# Patient Record
Sex: Female | Born: 1960 | Race: Black or African American | Hispanic: No | Marital: Married | Smoking: Never smoker
Health system: Southern US, Community
[De-identification: ages and names within clinical notes are randomized; demographics above are authoritative.]

## PROBLEM LIST (undated history)

## (undated) DIAGNOSIS — J449 Chronic obstructive pulmonary disease, unspecified: Secondary | ICD-10-CM

## (undated) DIAGNOSIS — I509 Heart failure, unspecified: Secondary | ICD-10-CM

## (undated) DIAGNOSIS — I219 Acute myocardial infarction, unspecified: Secondary | ICD-10-CM

---

## 2018-02-01 ENCOUNTER — Other Ambulatory Visit: Payer: Self-pay

## 2018-02-01 ENCOUNTER — Inpatient Hospital Stay (HOSPITAL_COMMUNITY)
Admission: EM | Admit: 2018-02-01 | Discharge: 2018-02-02 | DRG: 641 | Disposition: A | Discharge status: Home / Self Care | Payer: Medicare Other | Attending: Internal Medicine | Admitting: Internal Medicine

## 2018-02-01 ENCOUNTER — Encounter (HOSPITAL_COMMUNITY): Payer: Self-pay | Admitting: Emergency Medicine

## 2018-02-01 ENCOUNTER — Emergency Department (HOSPITAL_COMMUNITY): Payer: Medicare Other

## 2018-02-01 DIAGNOSIS — Z7902 Long term (current) use of antithrombotics/antiplatelets: Secondary | ICD-10-CM

## 2018-02-01 DIAGNOSIS — I509 Heart failure, unspecified: Secondary | ICD-10-CM | POA: Diagnosis not present

## 2018-02-01 DIAGNOSIS — T502X5A Adverse effect of carbonic-anhydrase inhibitors, benzothiadiazides and other diuretics, initial encounter: Secondary | ICD-10-CM | POA: Diagnosis not present

## 2018-02-01 DIAGNOSIS — E869 Volume depletion, unspecified: Secondary | ICD-10-CM | POA: Diagnosis present

## 2018-02-01 DIAGNOSIS — N179 Acute kidney failure, unspecified: Secondary | ICD-10-CM | POA: Diagnosis present

## 2018-02-01 DIAGNOSIS — Z79899 Other long term (current) drug therapy: Secondary | ICD-10-CM

## 2018-02-01 DIAGNOSIS — E873 Alkalosis: Secondary | ICD-10-CM | POA: Diagnosis not present

## 2018-02-01 DIAGNOSIS — I2510 Atherosclerotic heart disease of native coronary artery without angina pectoris: Secondary | ICD-10-CM | POA: Diagnosis not present

## 2018-02-01 DIAGNOSIS — J449 Chronic obstructive pulmonary disease, unspecified: Secondary | ICD-10-CM | POA: Diagnosis present

## 2018-02-01 DIAGNOSIS — Z87891 Personal history of nicotine dependence: Secondary | ICD-10-CM

## 2018-02-01 DIAGNOSIS — E876 Hypokalemia: Principal | ICD-10-CM | POA: Diagnosis present

## 2018-02-01 DIAGNOSIS — R1110 Vomiting, unspecified: Secondary | ICD-10-CM

## 2018-02-01 DIAGNOSIS — R5382 Chronic fatigue, unspecified: Secondary | ICD-10-CM | POA: Diagnosis not present

## 2018-02-01 DIAGNOSIS — R5383 Other fatigue: Secondary | ICD-10-CM | POA: Diagnosis present

## 2018-02-01 DIAGNOSIS — R911 Solitary pulmonary nodule: Secondary | ICD-10-CM

## 2018-02-01 DIAGNOSIS — R55 Syncope and collapse: Secondary | ICD-10-CM

## 2018-02-01 DIAGNOSIS — J9811 Atelectasis: Secondary | ICD-10-CM | POA: Diagnosis not present

## 2018-02-01 DIAGNOSIS — Z9861 Coronary angioplasty status: Secondary | ICD-10-CM

## 2018-02-01 DIAGNOSIS — I252 Old myocardial infarction: Secondary | ICD-10-CM | POA: Diagnosis not present

## 2018-02-01 DIAGNOSIS — I4581 Long QT syndrome: Secondary | ICD-10-CM

## 2018-02-01 DIAGNOSIS — I25118 Atherosclerotic heart disease of native coronary artery with other forms of angina pectoris: Secondary | ICD-10-CM

## 2018-02-01 HISTORY — DX: Heart failure, unspecified: I50.9

## 2018-02-01 HISTORY — DX: Acute myocardial infarction, unspecified: I21.9

## 2018-02-01 HISTORY — DX: Chronic obstructive pulmonary disease, unspecified: J44.9

## 2018-02-01 LAB — CBC WITH DIFFERENTIAL/PLATELET
Abs Immature Granulocytes: 0.01 10*3/uL (ref 0.00–0.07)
BASOS PCT: 1 %
Basophils Absolute: 0 10*3/uL (ref 0.0–0.1)
EOS PCT: 1 %
Eosinophils Absolute: 0 10*3/uL (ref 0.0–0.5)
HEMATOCRIT: 38.3 % (ref 36.0–46.0)
Hemoglobin: 12.5 g/dL (ref 12.0–15.0)
Immature Granulocytes: 0 %
LYMPHS ABS: 2.4 10*3/uL (ref 0.7–4.0)
Lymphocytes Relative: 30 %
MCH: 30.4 pg (ref 26.0–34.0)
MCHC: 32.6 g/dL (ref 30.0–36.0)
MCV: 93.2 fL (ref 80.0–100.0)
MONOS PCT: 6 %
Monocytes Absolute: 0.5 10*3/uL (ref 0.1–1.0)
NEUTROS PCT: 62 %
Neutro Abs: 4.9 10*3/uL (ref 1.7–7.7)
PLATELETS: 332 10*3/uL (ref 150–400)
RBC: 4.11 MIL/uL (ref 3.87–5.11)
RDW: 13.3 % (ref 11.5–15.5)
WBC: 7.8 10*3/uL (ref 4.0–10.5)
nRBC: 0 % (ref 0.0–0.2)

## 2018-02-01 LAB — MAGNESIUM: Magnesium: 2.1 mg/dL (ref 1.7–2.4)

## 2018-02-01 LAB — BRAIN NATRIURETIC PEPTIDE: B Natriuretic Peptide: 15 pg/mL (ref 0.0–100.0)

## 2018-02-01 LAB — CBG MONITORING, ED: GLUCOSE-CAPILLARY: 139 mg/dL — AB (ref 70–99)

## 2018-02-01 LAB — I-STAT TROPONIN, ED: Troponin i, poc: 0 ng/mL (ref 0.00–0.08)

## 2018-02-01 LAB — BASIC METABOLIC PANEL
ANION GAP: 10 (ref 5–15)
Anion gap: 12 (ref 5–15)
BUN: 34 mg/dL — AB (ref 6–20)
BUN: 33 mg/dL — AB (ref 6–20)
CALCIUM: 9.9 mg/dL (ref 8.9–10.3)
CHLORIDE: 98 mmol/L (ref 98–111)
CO2: 28 mmol/L (ref 22–32)
CO2: 29 mmol/L (ref 22–32)
CREATININE: 1.68 mg/dL — AB (ref 0.44–1.00)
Calcium: 9.5 mg/dL (ref 8.9–10.3)
Chloride: 96 mmol/L — ABNORMAL LOW (ref 98–111)
Creatinine, Ser: 1.41 mg/dL — ABNORMAL HIGH (ref 0.44–1.00)
GFR calc Af Amer: 38 mL/min — ABNORMAL LOW (ref >60)
GFR calc Af Amer: 47 mL/min — ABNORMAL LOW (ref >60)
GFR calc non Af Amer: 40 mL/min — ABNORMAL LOW (ref >60)
GFR, EST NON AFRICAN AMERICAN: 33 mL/min — AB (ref >60)
GLUCOSE: 140 mg/dL — AB (ref 70–99)
GLUCOSE: 130 mg/dL — AB (ref 70–99)
POTASSIUM: 3.1 mmol/L — AB (ref 3.5–5.1)
Potassium: 2.5 mmol/L — CL (ref 3.5–5.1)
Sodium: 136 mmol/L (ref 135–145)
Sodium: 137 mmol/L (ref 135–145)

## 2018-02-01 LAB — I-STAT ARTERIAL BLOOD GAS, ED
ACID-BASE EXCESS: 8 mmol/L — AB (ref 0.0–2.0)
BICARBONATE: 31.2 mmol/L — AB (ref 20.0–28.0)
O2 SAT: 93 %
PCO2 ART: 36.1 mmHg (ref 32.0–48.0)
PH ART: 7.545 — AB (ref 7.350–7.450)
PO2 ART: 58 mmHg — AB (ref 83.0–108.0)
Patient temperature: 98.6
TCO2: 32 mmol/L (ref 22–32)

## 2018-02-01 LAB — HEMOGLOBIN A1C
Hgb A1c MFr Bld: 6 % — ABNORMAL HIGH (ref 4.8–5.6)
Mean Plasma Glucose: 125.5 mg/dL

## 2018-02-01 LAB — TSH: TSH: 0.662 u[IU]/mL (ref 0.350–4.500)

## 2018-02-01 LAB — VITAMIN B12: Vitamin B-12: 237 pg/mL (ref 180–914)

## 2018-02-01 MED ORDER — ACETAMINOPHEN 650 MG RE SUPP: 650.0000 mg | Freq: Four times a day (QID) | RECTAL | Status: DC | PRN

## 2018-02-01 MED ORDER — IOPAMIDOL (ISOVUE-370) INJECTION 76%
INTRAVENOUS | Status: AC
  Filled 2018-02-01: qty 100

## 2018-02-01 MED ORDER — SODIUM CHLORIDE 0.9% FLUSH
3.0000 mL | Freq: Two times a day (BID) | INTRAVENOUS | Status: DC
  Administered 2018-02-01: 3 mL via INTRAVENOUS

## 2018-02-01 MED ORDER — ENOXAPARIN SODIUM 40 MG/0.4ML ~~LOC~~ SOLN
40.0000 mg | SUBCUTANEOUS | Status: DC
  Administered 2018-02-01: 40 mg via SUBCUTANEOUS
  Filled 2018-02-01: qty 0.4

## 2018-02-01 MED ORDER — IOPAMIDOL (ISOVUE-370) INJECTION 76%
80.0000 mL | Freq: Once | INTRAVENOUS | Status: AC | PRN
  Administered 2018-02-01: 80 mL via INTRAVENOUS

## 2018-02-01 MED ORDER — ACETAMINOPHEN 325 MG PO TABS: 650.0000 mg | ORAL_TABLET | Freq: Four times a day (QID) | ORAL | Status: DC | PRN

## 2018-02-01 MED ORDER — CLOPIDOGREL BISULFATE 75 MG PO TABS
75.0000 mg | ORAL_TABLET | Freq: Every day | ORAL | Status: DC
  Administered 2018-02-02: 75 mg via ORAL
  Filled 2018-02-01: qty 1

## 2018-02-01 MED ORDER — POTASSIUM CHLORIDE 10 MEQ/100ML IV SOLN
10.0000 meq | Freq: Once | INTRAVENOUS | Status: AC
  Administered 2018-02-01: 10 meq via INTRAVENOUS
  Filled 2018-02-01: qty 100

## 2018-02-01 MED ORDER — POTASSIUM CHLORIDE CRYS ER 20 MEQ PO TBCR
40.0000 meq | EXTENDED_RELEASE_TABLET | Freq: Two times a day (BID) | ORAL | Status: DC
  Administered 2018-02-01: 40 meq via ORAL
  Filled 2018-02-01: qty 2

## 2018-02-01 MED ORDER — POTASSIUM CHLORIDE CRYS ER 20 MEQ PO TBCR
60.0000 meq | EXTENDED_RELEASE_TABLET | Freq: Once | ORAL | Status: AC
  Administered 2018-02-01: 60 meq via ORAL
  Filled 2018-02-01: qty 3

## 2018-02-01 MED ORDER — ATORVASTATIN CALCIUM 80 MG PO TABS: 80.0000 mg | ORAL_TABLET | Freq: Every day | ORAL | Status: DC

## 2018-02-01 MED ORDER — POTASSIUM CHLORIDE CRYS ER 20 MEQ PO TBCR
40.0000 meq | EXTENDED_RELEASE_TABLET | Freq: Two times a day (BID) | ORAL | Status: AC
  Administered 2018-02-02: 40 meq via ORAL
  Filled 2018-02-01 (×2): qty 2

## 2018-02-01 NOTE — Progress Notes (Signed)
Patient arrives to 3[facility], c/a/ox4. She denies complaints at this time. Pt was connected to fluids that were no longer infusing.

## 2018-02-01 NOTE — ED Provider Notes (Signed)
[facility] EMERGENCY DEPARTMENT Provider Note   CSN: [id number] Arrival date & time: 02/01/18  1246     History   Chief Complaint Chief Complaint  Patient presents with  . Altered Mental Status  . Emesis    HPI Monica Garza is a 57 y.o. female.  HPI level 5 caveat acuity of situation.  She is obtained from patient's daughter and husband.  Patient complains of shortness of breath chronically since her "heart attack" in 2017.  Today she was more short of breath and today while walking into the hospital to visit another person she had 2 syncopal events 5 minutes apart.  Each syncopal event she was unconscious for approximately 1 minute.  She denies pain anywhere at present.  She does admit to shortness of breath.  Her daughter reports that she vomited after 1 of the syncopal events.  No treatment prior to coming here.  Denies headache denies chest pain denies abdominal pain.  No other associated symptoms.  She had a similar episode approximately a month ago.  She was hospitalized overnight in [place].  Past Medical History:  Diagnosis Date  . CHF (congestive heart failure) (HCC)   . COPD (chronic obstructive pulmonary disease) (HCC)   . MI (myocardial infarction) (HCC)     There are no active problems to display for this patient.   History reviewed. No pertinent surgical history.   OB History   None      Home Medications    Prior to Admission medications   Not on File    Family History No family history on file.  Social History Social History   Tobacco Use  . Smoking status: Never Smoker  . Smokeless tobacco: Never Used  Substance Use Topics  . Alcohol use: Not Currently  . Drug use: Not Currently     Allergies   Patient has no known allergies.   Review of Systems Review of Systems  Unable to perform ROS: Acuity of condition  Respiratory: Positive for shortness of breath.   Cardiovascular:       Syncope    Gastrointestinal: Positive for vomiting.     Physical Exam Updated Vital Signs BP 107/64 (BP Location: Right Arm)   Pulse 77   Temp 98.4 F (36.9 C) (Oral)   Resp 16   Ht 5\' 11"  (1.803 m)   Wt 101.6 kg   SpO2 98%   BMI 31.24 kg/m   Physical Exam  Constitutional:  Moderately ill-appearing.  Glasgow Coma Score 15  HENT:  Head: Normocephalic and atraumatic.  Eyes: Pupils are equal, round, and reactive to light. Conjunctivae are normal.  Neck: Neck supple. No tracheal deviation present. No thyromegaly present.  Cardiovascular: Normal rate and regular rhythm.  No murmur heard. Pulmonary/Chest: Effort normal and breath sounds normal.  Abdominal: Soft. Bowel sounds are normal. She exhibits no distension. There is no tenderness.  Musculoskeletal: Normal range of motion. She exhibits no edema or tenderness.  Neurological: She is alert. Coordination normal.  Skin: Skin is warm and dry. No rash noted.  Psychiatric: She has a normal mood and affect.  Nursing note and vitals reviewed.    ED Treatments / Results  Labs (all labs ordered are listed, but only abnormal results are displayed) Labs Reviewed  CBG MONITORING, ED - Abnormal; Notable for the following components:      Result Value   Glucose-Capillary 139 (*)    All other components within normal limits  BLOOD GAS, ARTERIAL  CBC  WITH DIFFERENTIAL/PLATELET  BASIC METABOLIC PANEL  I-STAT TROPONIN, ED    EKG None  Radiology No results found.  Procedures Procedures (including critical care time)  Medications Ordered in ED Medications - No data to display Results for orders placed or performed during the hospital encounter of 02/01/18  CBC with Differential/Platelet  Result Value Ref Range   WBC 7.8 4.0 - 10.5 K/uL   RBC 4.11 3.87 - 5.11 MIL/uL   Hemoglobin 12.5 12.0 - 15.0 g/dL   HCT 16.1 09.6 - 04.5 %   MCV 93.2 80.0 - 100.0 fL   MCH 30.4 26.0 - 34.0 pg   MCHC 32.6 30.0 - 36.0 g/dL   RDW 40.9 81.1 - 91.4  %   Platelets 332 150 - 400 K/uL   nRBC 0.0 0.0 - 0.2 %   Neutrophils Relative % 62 %   Neutro Abs 4.9 1.7 - 7.7 K/uL   Lymphocytes Relative 30 %   Lymphs Abs 2.4 0.7 - 4.0 K/uL   Monocytes Relative 6 %   Monocytes Absolute 0.5 0.1 - 1.0 K/uL   Eosinophils Relative 1 %   Eosinophils Absolute 0.0 0.0 - 0.5 K/uL   Basophils Relative 1 %   Basophils Absolute 0.0 0.0 - 0.1 K/uL   Immature Granulocytes 0 %   Abs Immature Granulocytes 0.01 0.00 - 0.07 K/uL  Basic metabolic panel  Result Value Ref Range   Sodium 136 135 - 145 mmol/L   Potassium 2.5 (LL) 3.5 - 5.1 mmol/L   Chloride 96 (L) 98 - 111 mmol/L   CO2 28 22 - 32 mmol/L   Glucose, Bld 140 (H) 70 - 99 mg/dL   BUN 34 (H) 6 - 20 mg/dL   Creatinine, Ser 7.82 (H) 0.44 - 1.00 mg/dL   Calcium 9.9 8.9 - 95.6 mg/dL   GFR calc non Af Amer 33 (L) >60 mL/min   GFR calc Af Amer 38 (L) >60 mL/min   Anion gap 12 5 - 15  Magnesium  Result Value Ref Range   Magnesium 2.1 1.7 - 2.4 mg/dL  CBG monitoring, ED  Result Value Ref Range   Glucose-Capillary 139 (H) 70 - 99 mg/dL   Comment 1 Notify RN    Comment 2 Document in Chart   I-stat troponin, ED  Result Value Ref Range   Troponin i, poc 0.00 0.00 - 0.08 ng/mL   Comment 3          I-Stat arterial blood gas, ED  Result Value Ref Range   pH, Arterial 7.545 (H) 7.350 - 7.450   pCO2 arterial 36.1 32.0 - 48.0 mmHg   pO2, Arterial 58.0 (L) 83.0 - 108.0 mmHg   Bicarbonate 31.2 (H) 20.0 - 28.0 mmol/L   TCO2 32 22 - 32 mmol/L   O2 Saturation 93.0 %   Acid-Base Excess 8.0 (H) 0.0 - 2.0 mmol/L   Patient temperature 98.6 F    Collection site RADIAL, ALLEN'S TEST ACCEPTABLE    Drawn by RT    Sample type ARTERIAL    Ct Angio Chest Pe W And/or Wo Contrast  Result Date: 02/01/2018 CLINICAL DATA:  57 year old with syncopal episode. Pulmonary embolism suspected. EXAM: CT ANGIOGRAPHY CHEST WITH CONTRAST TECHNIQUE: Multidetector CT imaging of the chest was performed using the standard protocol  during bolus administration of intravenous contrast. Multiplanar CT image reconstructions and MIPs were obtained to evaluate the vascular anatomy. CONTRAST:  80mL ISOVUE-370 IOPAMIDOL (ISOVUE-370) INJECTION 76% COMPARISON:  Chest radiograph 02/01/2018 FINDINGS: Cardiovascular: Satisfactory opacification  of the pulmonary arteries to the segmental level. No evidence of pulmonary embolism. Normal heart size. No pericardial effusion. Normal caliber of the thoracic aorta. Mediastinum/Nodes: No mediastinal or hilar lymphadenopathy. Thyroid tissue is unremarkable. No significant axillary lymph node enlargement. Contrast in the distal esophagus. Lungs/Pleura: Trachea and mainstem bronchi are patent. Focal nodular thickening along the right minor fissure on sequence 6, image 63 measures roughly 3 mm. Hazy patchy densities in both lungs are suggestive for volume loss. No large areas of airspace disease or consolidation. Upper Abdomen: Cholecystectomy. No acute abnormality in the upper abdomen. Musculoskeletal: No acute bone abnormality. Review of the MIP images confirms the above findings. IMPRESSION: 1. Negative for pulmonary embolism. 2. Hazy and ground-glass densities in the dependent lungs are most compatible with atelectasis. No significant airspace disease or consolidation. 3. 3 mm pleural-based nodular density along the right minor fissure is probably an incidental finding. No follow-up needed if patient is low-risk. Non-contrast chest CT can be considered in 12 months if patient is high-risk. This recommendation follows the consensus statement: Guidelines for Management of Incidental Pulmonary Nodules Detected on CT Images: From the Fleischner Society 2017; Radiology 2017; 284:228-243. Electronically Signed   By: Richarda Overlie M.D.   On: 02/01/2018 15:40   Dg Chest Port 1 View  Result Date: 02/01/2018 CLINICAL DATA:  Short of breath. EXAM: PORTABLE CHEST 1 VIEW COMPARISON:  None. FINDINGS: Cardiac silhouette normal  in size. No mediastinal or hilar masses. No evidence of adenopathy. Clear lungs.  No pleural effusion or pneumothorax. Skeletal structures are grossly intact. IMPRESSION: No active disease. Electronically Signed   By: Amie Portland M.D.   On: 02/01/2018 14:00    Initial Impression / Assessment and Plan / ED Course  I have reviewed the triage vital signs and the nursing notes.  Pertinent labs & imaging results that were available during my care of the patient were reviewed by me and considered in my medical decision making (see chart for details).     4 PM patient is more alert after treatment with oral potassium supplementation. Lab work consistent with hypokalemia and renal insufficiency.  I consulted internal medicine service who will arrange for overnight stay.  Final Clinical Impressions(s) / ED Diagnoses  Dx #1 syncope #2 hypokalemia #3 renal insufficiency #4 pulmonary nodule Final diagnoses:  None    ED Discharge Orders    None       Doug Sou, MD 02/01/18 [phone]

## 2018-02-01 NOTE — H&P (Signed)
Date: 02/01/2018               Patient Name:  Monica Garza MRN: [id number]  DOB: [birth year] Age / Sex: 57 y.o., female   PCP: No primary care provider on file.         Medical Service: Internal Medicine Teaching Service         Attending Physician: Dr. Doneen Poisson, MD    First Contact: Dr. Cleaster Corin Pager: [phone]  Second Contact: Dr. Lovenia Kim Pager: [phone]       After Hours (After 5p/  First Contact Pager: [phone]  weekends / holidays): Second Contact Pager: [phone]   Chief Complaint: Syncope  History of Present Illness:  Monica Garza is a 57yo female w/PMH of, CHF, COPD, chronic fatigue, recurrent episodes of syncope and hypokalemia, MI s/p cath 2017, and CAD presenting to the [facility] ED after having two syncopal episodes, vomiting, and found to be hypokalemic. She is accompanied by her daughter and husband, and all three were visiting from [place] to see the patient's mother in the hospital.  The patient states she has chronic fatigue, hypokalemia and recurrent syncopal episodes about once a month that require hospitalization that have been occurring since her MI in 2017. Earlier today she began to feel weaker than usual. She told her family she did not feel well before passing out while sitting. She had one episode of non-bloody emesis and passed out again. Her family thinks this maybe lasted for one minute. Prior to these episodes she only sometimes has dizziness, nausea, and vomiting. Her husband states she is usually very tired for about two days after these events. She occasionally has numbness and tingling in her hands and fingers but states this does not happen often.  She has had no recent sick contacts, changes in BM, or recent illness. She occasionally has chest pain for which she takes nitro but has not taken it today.  She takes potassium supplements daily and was recently switched from a tablet to a capsule, which makes her nauseous. She states she may have missed one or  two doses but rarely misses. She is unsure of her other medications and prior workup. She states that usually she is admitted for hypokalemia and is discharged the next day without much workup. She does not have a list of her medication dosages with her.   Her PCP is located at [facility] in [place], [place].  Cardiologist is Dr. Roderic Scarce in [place], [place]   Meds:  Current Meds  Medication Sig  . albuterol (PROVENTIL HFA;VENTOLIN HFA) 108 (90 Base) MCG/ACT inhaler Inhale 2 puffs into the lungs every 6 (six) hours as needed for wheezing or shortness of breath.  . AMLODIPINE BENZOATE PO Take by mouth.  . ATORVASTATIN CALCIUM PO Take by mouth.  Marland Kitchen CARVEDILOL PO Take by mouth 2 (two) times daily.  . CHLORTHALIDONE PO Take by mouth.  . clopidogrel (PLAVIX) 75 MG tablet Take 75 mg by mouth daily.  Marland Kitchen HYDROcodone-acetaminophen (NORCO/VICODIN) 5-325 MG tablet Take 1 tablet by mouth every 6 (six) hours as needed for moderate pain.  Marland Kitchen OMEPRAZOLE PO Take by mouth.     Allergies: Allergies as of 02/01/2018  . (No Known Allergies)   Past Medical History:  Diagnosis Date  . CHF (congestive heart failure) (HCC)   . COPD (chronic obstructive pulmonary disease) (HCC)   . MI (myocardial infarction) (HCC)     Family History:  Mother: thyroid disorder, ALS, CKD Father:  throat cancer  Social History:  She lives at home with her husband in [place], [place]. Her daughter also lives in [place]. Her mother stays at [facility] and is on chronic ventilation and is currently in [facility].  She does not use tobacco   Review of Systems: A complete ROS was negative except as per HPI.   Physical Exam: Blood pressure 117/83, pulse (!) 59, temperature 97.8 F (36.6 C), temperature source Oral, resp. rate 20, height 5\' 11"  (1.803 m), weight 101.6 kg, SpO2 99 %.  Physical Exam  Constitution: NAD, appears tired but stated age, lying supine in bed HEENT: EOM intact, AT, [place] Cardio: RRR, no m/r/g,  +JVD Respiratory: CTAB, no w/r/r Abdominal: soft, NTTP, +BS, non-distended MSK: moving all extremities, sensation intact Neuro: CN II-XII intact, a&ox3, cooperative Skin: c/d/i, no edema   EKG: personally reviewed my interpretation is QT prolongation and abnormal R wave progression.   CXR: personally reviewed my interpretation is   CTA 02/01/2018 IMPRESSION: 1. Negative for pulmonary embolism. 2. Hazy and ground-glass densities in the dependent lungs are most compatible with atelectasis. No significant airspace disease or consolidation. 3. 3 mm pleural-based nodular density along the right minor fissure is probably an incidental finding. No follow-up needed if patient is low-risk. Non-contrast chest CT can be considered in 12 months if patient is high-risk. This recommendation follows the consensus statement: Guidelines for Management of Incidental Pulmonary Nodules Detected on CT Images: From the Fleischner Society 2017; Radiology 2017; 284:228-243. Electronically Signed   By: Richarda Overlie M.D.   On: 02/01/2018 15:40  Assessment & Plan by Problem: Active Problems:   Syncope   57yo female w/PMH of, CHF, COPD, chronic fatigue, recurrent episodes of syncope and hypokalemia, MI s/p cath 2017, and CAD presenting to the [facility] ED after having two syncopal episodes, vomiting, and found to be hypokalemic.   Syncopal Episode History of chronic fatigue with recurrent syncopal episodes associated with hypokalemia since MI in 2017. She also has intermittent bilaterally LE & UE numbness. She is from out of town and we are currently trying to obtain her medical and medication records. She is unsure of prior workup. CTA without acute findings. ABG shows alkalosis but is not consistent with saturations or BMP and seems to possibly be in error. We will repeat BMP. This appears to be cardiac in origin vs. Seizure like episode as she has increased fatigue for two days after symptoms occur. Previous EKGs  have all been within normal limits according to the family.   - orthostatics - ECHO  - repeat BMP now and am - TSH, RPR, B12, HbA1c - sent fax to [facility] in [place] for records, fax: [phone]  Chronic Heart Failure Unclear if diastolic, systolic or combined. She does not appear hypervolemic on exam except for mild JVD.   - BNP  - I/O's, daily weights   Hypokalemia K+ 2.5. She takes potassium supplements daily. Received po and IV in the ED. She does take Chlorthalidone but length of time and dosage unknown.   - repeat BMP now and in the morning  - this evening and in the am  AKI or CKD Cr. 1.68 and GFR   CAD S/p MI and Cath 2017 without stent placement  - plavix 75 mg - lipitor 80  Diet: Heart Healthy VTE: lovenox IVF: none Code: Full  Dispo: Admit patient to Inpatient with expected length of stay greater than 2 midnights.  SignedVersie Starks, DO 02/01/2018, 7:44 PM  Pager: [phone]

## 2018-02-01 NOTE — ED Triage Notes (Addendum)
Pt arrives with family after a syncopal episode in waiting room upstairs. Pt alert to self upon arrival. Emesis noted on pt's clothes.

## 2018-02-02 DIAGNOSIS — T502X5A Adverse effect of carbonic-anhydrase inhibitors, benzothiadiazides and other diuretics, initial encounter: Secondary | ICD-10-CM | POA: Diagnosis not present

## 2018-02-02 DIAGNOSIS — I509 Heart failure, unspecified: Secondary | ICD-10-CM

## 2018-02-02 DIAGNOSIS — E876 Hypokalemia: Secondary | ICD-10-CM | POA: Diagnosis not present

## 2018-02-02 DIAGNOSIS — E538 Deficiency of other specified B group vitamins: Secondary | ICD-10-CM

## 2018-02-02 DIAGNOSIS — N179 Acute kidney failure, unspecified: Secondary | ICD-10-CM | POA: Diagnosis not present

## 2018-02-02 DIAGNOSIS — J9811 Atelectasis: Secondary | ICD-10-CM | POA: Diagnosis not present

## 2018-02-02 DIAGNOSIS — R55 Syncope and collapse: Secondary | ICD-10-CM | POA: Diagnosis not present

## 2018-02-02 LAB — COMPREHENSIVE METABOLIC PANEL
ALK PHOS: 78 U/L (ref 38–126)
ALT: 14 U/L (ref 0–44)
ANION GAP: 8 (ref 5–15)
AST: 16 U/L (ref 15–41)
Albumin: 3.5 g/dL (ref 3.5–5.0)
BUN: 31 mg/dL — ABNORMAL HIGH (ref 6–20)
CALCIUM: 9.4 mg/dL (ref 8.9–10.3)
CO2: 29 mmol/L (ref 22–32)
Chloride: 101 mmol/L (ref 98–111)
Creatinine, Ser: 1.4 mg/dL — ABNORMAL HIGH (ref 0.44–1.00)
GFR, EST AFRICAN AMERICAN: 47 mL/min — AB (ref >60)
GFR, EST NON AFRICAN AMERICAN: 41 mL/min — AB (ref >60)
Glucose, Bld: 148 mg/dL — ABNORMAL HIGH (ref 70–99)
Potassium: 3.2 mmol/L — ABNORMAL LOW (ref 3.5–5.1)
Sodium: 138 mmol/L (ref 135–145)
TOTAL PROTEIN: 8.2 g/dL — AB (ref 6.5–8.1)
Total Bilirubin: 0.8 mg/dL (ref 0.3–1.2)

## 2018-02-02 LAB — RPR: RPR Ser Ql: NONREACTIVE

## 2018-02-02 LAB — HIV ANTIBODY (ROUTINE TESTING W REFLEX): HIV Screen 4th Generation wRfx: NONREACTIVE

## 2018-02-02 MED ORDER — POTASSIUM CHLORIDE CRYS ER 20 MEQ PO TBCR
40.0000 meq | EXTENDED_RELEASE_TABLET | Freq: Once | ORAL | Status: AC
  Administered 2018-02-02: 40 meq via ORAL
  Filled 2018-02-02: qty 2

## 2018-02-02 NOTE — Progress Notes (Signed)
Patient resting comfortably during shift report. Denies complaints.  

## 2018-02-02 NOTE — Progress Notes (Signed)
Call to pharmacy to request pharm tech come complete patient's med-list

## 2018-02-02 NOTE — Progress Notes (Signed)
Internal Medicine Attending  Date: 02/02/2018  Patient name: Monica Garza Medical record number: [id number] Date of birth: 05/13/1960 Age: 57 y.o. Gender: female  I saw and evaluated the patient.  Please see my H&P dated February 02, 2018 and attached to Dr. Melynda Keller H&P dated February 01, 2018 for the specifics of my evaluation, assessment, plan from earlier in the day.

## 2018-02-02 NOTE — Care Management (Signed)
MD ordered DME 3n1 for patient.  [facility] would provide but patient does not feel she needs this and will decline this DME.

## 2018-02-02 NOTE — Discharge Instructions (Signed)
Ms. Etana, Beets were admitted to the hospital due to loss of unconsciousness. This was likely due to dehydration and low potassium. To help with this, we ask that you STOP taking Lasix and chlorthalidone.   Please make sure to follow up with Dr. Earlene Plater as you might need other changes in your blood pressure medicines now that we are stopping two of them.

## 2018-02-02 NOTE — Discharge Summary (Addendum)
Name: Monica Garza MRN: [id number] DOB: 12-03-1960 57 y.o. PCP: Bridgette Habermann, FNP  Date of Admission: 02/01/2018 12:46 PM Date of Discharge: 02/02/2018 Attending Physician: No att. providers found  Discharge Diagnosis: 1. Recurrent Syncope  2. Hypokalemia  Discharge Medications: Allergies as of 02/02/2018   No Known Allergies     Medication List    STOP taking these medications   CHLORTHALIDONE PO   furosemide 40 MG tablet Commonly known as:  LASIX     TAKE these medications   albuterol 108 (90 Base) MCG/ACT inhaler Commonly known as:  PROVENTIL HFA;VENTOLIN HFA Inhale 2 puffs into the lungs every 6 (six) hours as needed for wheezing or shortness of breath.   AMLODIPINE BENZOATE PO Take 10 mg by mouth daily.   aspirin EC 81 MG tablet Take 81 mg by mouth daily.   ATORVASTATIN CALCIUM PO Take 80 mg by mouth daily.   budesonide-formoterol 160-4.5 MCG/ACT inhaler Commonly known as:  SYMBICORT Inhale 2 puffs into the lungs 2 (two) times daily.   CARVEDILOL PO Take 25 mg by mouth 2 (two) times daily.   clopidogrel 75 MG tablet Commonly known as:  PLAVIX Take 75 mg by mouth daily.   HYDROcodone-acetaminophen 5-325 MG tablet Commonly known as:  NORCO/VICODIN Take 1 tablet by mouth every 6 (six) hours as needed for moderate pain.   nitroGLYCERIN 0.4 MG SL tablet Commonly known as:  NITROSTAT Place 0.4 mg under the tongue every 5 (five) minutes as needed for chest pain.   OMEPRAZOLE PO Take 40 mg by mouth daily.   sertraline 25 MG tablet Commonly known as:  ZOLOFT Take 25 mg by mouth daily.   Vitamin D 50 MCG (2000 UT) tablet Take 2,000 Units by mouth daily.       Disposition and follow-up:   Ms.Loralyn Breau was discharged from [facility] in Stable condition.  At the hospital follow up visit please address:  1.  Recurrent Syncope: likely secondary to patient's diuretics and her chlorthalidone and lasix were held. History of  decreased EF in 2017 that has resolved. No recent symptoms of fluid overload. Reassess volemic state on follow-up. PT recommended pulmonary rehab for dyspnea with mild exertion.  Hypokalemia: K+ 3.2 at discharge. Given more K+ prior to discharge B12 Deficiency: Mild, 237. Has occasionally b/l numbness/tingling in LE & UE and may benefit from B12 supplement.  2.  Labs / imaging needed at time of follow-up: BMP  3.  Pending labs/ test needing follow-up: none  Follow-up Appointments:   Follow-up with cardiologist - patients records faxed to office   Hospital Course by problem list: Monica Garza is a 57yo female w/PMH of, CHF, COPD, chronic fatigue, recurrent episodes of syncope and hypokalemia, MI s/p cath 2017, and CAD presenting to the [facility] ED after having two syncopal episodes, vomiting, and found to be hypokalemic. She was visiting her mother from out of town and had no records on file with our system. She has a history of recurrent hypokalemia and syncope since her MI in 2017 without known etiology. Records were obtained from [facility], and showed last hospitalization was one month ago. During this admission, RPR, TSH, HbA1c, BNP, troponin were all wnl. B12 was borderline low. She has a history of MI in 2017 with ECHO at the time showing reduced EF. Echo was repeated at her last previous hospitalization on 10/06 and showed normalization of EF and no systolic or diastolic dysfunction.  As she has been on chlorthalidone  and lasix for several years, it appears possible her symptoms and hypokalemia are secondary to overdiuresis. Per patient and family she has not had symptoms of leg swelling since her MI that she can remember. PT and OT worked with patient and recommended pulmonary rehab as she has dyspnea with minor exertion but no assocaited radiological or lab findings. This may be associated with deconditioned state as well since she has had ongoing fatigue for the past few years. CTA did  show a 3mm nodule, but patient has no history of tobacco use. She was discharged with discontinuation of her lasix and chlorthalidone with recommendation of close follow-up with her PCP and cardiologist.   Discharge Vitals:   BP 112/76 (BP Location: Right Arm)   Pulse 64   Temp 98.5 F (36.9 C) (Oral)   Resp 16   Ht 5\' 11"  (1.803 m)   Wt 91.4 kg   SpO2 97%   BMI 28.10 kg/m   Pertinent Labs, Studies, and Procedures:   Orthostatics Orthostatic Vitals: Supine: 123/71 (87) Sitting: 117/72 Standing: 98/68 (78)  DG Chest 11/09 IMPRESSION: No active disease.    By: Amie Portland M.D.   On: 02/01/2018 14:00  Discharge Instructions: Discharge Instructions    Call MD for:  persistant dizziness or light-headedness   Complete by:  As directed    Diet - low sodium heart healthy   Complete by:  As directed    Diet - low sodium heart healthy   Complete by:  As directed    Discharge instructions   Complete by:  As directed    You were hospitalized for syncopal episode and hypokalemia. Thank you for allowing [place] to be part of your care.   Please call your cardiologist, Dr. Earlene Plater, on Monday and schedule a follow-up hospital appointment for later this week. Please follow-up closely in the next week with your primary care physician as well. We will fax your records to your cardiologist's office.    Please note these changes made to your medications:   Please STOP taking:  Chlorthalidone 25 mg tablets  Furosemide (Lasix) 40 mg tablet  These medications are to help you urinate off excess fluid and can cause low potassium. If you start to notice swelling in your legs or have increased SOB, please contact your primary care physician or cardiologist earlier to discuss if you should resume these medications.   Increase activity slowly   Complete by:  As directed    Increase activity slowly   Complete by:  As directed       Signed: Guinevere Scarlet A, DO 02/03/2018, 11:48 AM   Pager:  [phone]

## 2018-02-02 NOTE — Evaluation (Signed)
Occupational Therapy Evaluation and Discharge Patient Details Name: Monica Garza MRN: [id number] DOB: 1960/09/04 Today's Date: 02/02/2018    History of Present Illness Pt is a 57 y.o. F with significant PMH of CHF, COPD, chronic fatigue, recurrent episodes of syncope and hypokalemia, MI s/p cath 2017, and CAD who presents after having two syncopal episodes, vomiting and found to be hypokalemic. Has recurrent syncopal episodes about once a month that require hospitalization.   Clinical Impression   Pt is functioning modified independently in mobility and ADL, fatigues easily and requires frequent rest breaks. Educated in energy conservation strategies and recommended seated showering. No further OT needs.    Follow Up Recommendations  No OT follow up    Equipment Recommendations  None recommended by OT(pt will get shower seat from mother's home)    Recommendations for Other Services       Precautions / Restrictions Precautions Precautions: Other (comment) Precaution Comments: watch BP Restrictions Weight Bearing Restrictions: No      Mobility Bed Mobility Overal bed mobility: Independent                Transfers Overall transfer level: Independent Equipment used: None                  Balance Overall balance assessment: No apparent balance deficits (not formally assessed)                                         ADL either performed or assessed with clinical judgement   ADL Overall ADL's : Modified independent                                       General ADL Comments: Educated pt in energy conservation and reinforced with handout.     Vision Patient Visual Report: No change from baseline       Perception     Praxis      Pertinent Vitals/Pain Pain Assessment: No/denies pain     Hand Dominance Right   Extremity/Trunk Assessment Upper Extremity Assessment Upper Extremity Assessment: Overall WFL for tasks  assessed   Lower Extremity Assessment Lower Extremity Assessment: Overall WFL for tasks assessed   Cervical / Trunk Assessment Cervical / Trunk Assessment: Normal   Communication Communication Communication: No difficulties   Cognition Arousal/Alertness: Awake/alert Behavior During Therapy: WFL for tasks assessed/performed Overall Cognitive Status: Within Functional Limits for tasks assessed                                     General Comments       Exercises     Shoulder Instructions      Home Living Family/patient expects to be discharged to:: Private residence Living Arrangements: Spouse/significant other;Children Available Help at Discharge: Family Type of Home: House Home Access: Level entry     Home Layout: One level     Bathroom Shower/Tub: Chief Strategy Officer: Standard     Home Equipment: None          Prior Functioning/Environment Level of Independence: Independent        Comments: Limited community ambulator, does not drive. Becomes short of breath with walking to [employer]  OT Problem List:        OT Treatment/Interventions:      OT Goals(Current goals can be found in the care plan section) Acute Rehab OT Goals Patient Stated Goal: "figure out what's going on"  OT Frequency:     Barriers to D/C:            Co-evaluation              AM-PAC PT "6 Clicks" Daily Activity     Outcome Measure Help from another person eating meals?: None Help from another person taking care of personal grooming?: None Help from another person toileting, which includes using toliet, bedpan, or urinal?: None Help from another person bathing (including washing, rinsing, drying)?: None Help from another person to put on and taking off regular upper body clothing?: None Help from another person to put on and taking off regular lower body clothing?: None 6 Click Score: 24   End of Session    Activity Tolerance:  Patient limited by fatigue Patient left: in bed;with call bell/phone within reach  OT Visit Diagnosis: Other (comment)(decreased activity tolerance)                Time: 4098-1191 OT Time Calculation (min): 17 min Charges:  OT General Charges $OT Visit: 1 Visit OT Evaluation $OT Eval Low Complexity: 1 Low  Martie Round, OTR/L Acute Rehabilitation Services Pager: [phone] Office: [phone]  Evern Bio 02/02/2018, 1:14 PM

## 2018-02-02 NOTE — Evaluation (Signed)
Physical Therapy Evaluation Patient Details Name: Monica Garza MRN: [id number] DOB: 1960/03/29 Today's Date: 02/02/2018   History of Present Illness  Pt is a 57 y.o. F with significant PMH of CHF, COPD, chronic fatigue, recurrent episodes of syncope and hypokalemia, MI s/p cath 2017, and CAD who presents after having two syncopal episodes, vomiting and found to be hypokalemic. Has recurrent syncopal episodes about once a month that require hospitalization.  Clinical Impression  Pt admitted with above diagnosis. Pt currently with functional limitations due to the deficits listed below (see PT Problem List). On PT evaluation, patient presenting with decreased endurance. Ambulating 140 feet with no assistive device and required three standing breaks secondary to fatigue and dyspnea on exertion. SpO2 98% on RA. Orthostatics obtained (see below) and pt asymptomatic. Pt will benefit from skilled PT to increase their independence and safety with mobility to allow discharge to the venue listed below.    Orthostatic Vitals: Supine: 123/71 (87) Sitting: 117/72 Standing: 98/68 (78)     Follow Up Recommendations Other (comment) pulmonary rehab    Equipment Recommendations  3in1 (PT)    Recommendations for Other Services       Precautions / Restrictions Precautions Precautions: Other (comment) Precaution Comments: watch BP Restrictions Weight Bearing Restrictions: No      Mobility  Bed Mobility Overal bed mobility: Independent                Transfers Overall transfer level: Independent Equipment used: None                Ambulation/Gait Ambulation/Gait assistance: Modified independent (Device/Increase time) Gait Distance (Feet): 140 Feet Assistive device: None Gait Pattern/deviations: Decreased stride length Gait velocity: decreased for age   General Gait Details: Pt with no gross unsteadiness noted. Required 3 standing rest breaks secondary to fatigue and SOB. DOE  2/4, SpO2 98% on RA, HR 82.  Stairs            Wheelchair Mobility    Modified Rankin (Stroke Patients Only)       Balance Overall balance assessment: No apparent balance deficits (not formally assessed)                                           Pertinent Vitals/Pain Pain Assessment: No/denies pain    Home Living Family/patient expects to be discharged to:: Private residence Living Arrangements: Spouse/significant other;Children(daughter) Available Help at Discharge: Family Type of Home: House Home Access: Level entry     Home Layout: One level Home Equipment: None      Prior Function Level of Independence: Independent         Comments: Limited community ambulator, does not drive. Becomes short of breath with walking to mailbox     Hand Dominance        Extremity/Trunk Assessment   Upper Extremity Assessment Upper Extremity Assessment: Overall WFL for tasks assessed    Lower Extremity Assessment Lower Extremity Assessment: Overall WFL for tasks assessed    Cervical / Trunk Assessment Cervical / Trunk Assessment: Normal  Communication   Communication: No difficulties  Cognition Arousal/Alertness: Awake/alert Behavior During Therapy: WFL for tasks assessed/performed Overall Cognitive Status: Within Functional Limits for tasks assessed  General Comments      Exercises     Assessment/Plan    PT Assessment Patient needs continued PT services  PT Problem List Decreased activity tolerance;Decreased mobility;Cardiopulmonary status limiting activity       PT Treatment Interventions Gait training;Functional mobility training;Therapeutic activities;Therapeutic exercise;Balance training;Patient/family education    PT Goals (Current goals can be found in the Care Plan section)  Acute Rehab PT Goals Patient Stated Goal: "figure out what's going on" PT Goal Formulation: With  patient Time For Goal Achievement: 02/16/18 Potential to Achieve Goals: Good    Frequency Min 3X/week   Barriers to discharge        Co-evaluation               AM-PAC PT "6 Clicks" Daily Activity  Outcome Measure Difficulty turning over in bed (including adjusting bedclothes, sheets and blankets)?: None Difficulty moving from lying on back to sitting on the side of the bed? : None Difficulty sitting down on and standing up from a chair with arms (e.g., wheelchair, bedside commode, etc,.)?: None Help needed moving to and from a bed to chair (including a wheelchair)?: None Help needed walking in hospital room?: None Help needed climbing 3-5 steps with a railing? : A Little 6 Click Score: 23    End of Session   Activity Tolerance: Patient tolerated treatment well Patient left: in bed;with call bell/phone within reach Nurse Communication: Mobility status PT Visit Diagnosis: Difficulty in walking, not elsewhere classified (R26.2)    Time: 1610-9604 PT Time Calculation (min) (ACUTE ONLY): 27 min   Charges:   PT Evaluation $PT Eval Moderate Complexity: 1 Mod PT Treatments $Therapeutic Activity: 8-22 mins        Laurina Bustle, PT, DPT Acute Rehabilitation Services Pager [phone] Office [phone]   Vanetta Mulders 02/02/2018, 10:21 AM

## 2018-02-02 NOTE — Progress Notes (Signed)
   Subjective:  Patient's symptoms much improved today and she is more energetic. She denies dizziness, nausea, vomiting and is eating well.   Objective:  Vital signs in last 24 hours: Vitals:   02/01/18 1941 02/02/18 0022 02/02/18 0450 02/02/18 0801  BP: 117/83 (!) 95/56 118/68 112/76  Pulse: (!) 59 63 69 64  Resp: 20 18 20 16   Temp: 97.8 F (36.6 C) 98.5 F (36.9 C) 98.2 F (36.8 C) 98.5 F (36.9 C)  TempSrc: Oral Oral Oral Oral  SpO2: 99% 99% 100% 97%  Weight:   91.4 kg   Height:       Physical Exam  Constitution: NAD, lying supine in bed Cardio: RRR, no m/r/g Respiratory: CTAB, no w/r/r MSK: moving all extremities Neuro: Garza&o, cooperative, normal affect Skin: c/d/i    Assessment/Plan:  Active Problems:   Syncope and collapse   Congestive heart failure (HCC)   Hypokalemia   Recurrent Syncope Hypokalemia RPR, HbA1c, TSH all wnl. Potassium trending up. B12 slightly low. Patient is on chlorthalidone and lasix with normal EF on ECHO done in [place] and no signs of heart failure as seen on previous ECHO after her MI in 2017. Her symptoms are likely secondary to overdiuresis, and she no longer needs these medications. We will hold these medications at discharge and have her follow-up closely with her cardiologist.   - hold diuretics - orthostatics  - cont. K repletion   Module of Minor Fissure Seen on CT. She states she has never smoked tobacco and therefore will not need f/u of CT.   VTE: lovenox IVF: none Code: Full  Dispo: Anticipated discharge today.   Monica Scarlet A, DO 02/02/2018, 10:21 AM Pager: [phone]

## 2018-02-02 NOTE — Progress Notes (Signed)
Call placed to [facility] to notify of telemetry monitoring d/c.   

## 2019-06-15 ENCOUNTER — Other Ambulatory Visit (HOSPITAL_COMMUNITY): Payer: Self-pay | Admitting: General Practice

## 2019-06-15 ENCOUNTER — Other Ambulatory Visit: Payer: Self-pay | Admitting: General Practice

## 2019-06-15 DIAGNOSIS — H5340 Unspecified visual field defects: Secondary | ICD-10-CM

## 2019-07-08 ENCOUNTER — Other Ambulatory Visit: Payer: Self-pay

## 2019-07-08 ENCOUNTER — Ambulatory Visit (HOSPITAL_COMMUNITY)
Admission: RE | Admit: 2019-07-08 | Discharge: 2019-07-08 | Disposition: A | Discharge status: Home / Self Care | Payer: Medicare Other | Source: Ambulatory Visit | Attending: General Practice | Admitting: General Practice

## 2019-07-08 DIAGNOSIS — H5340 Unspecified visual field defects: Secondary | ICD-10-CM | POA: Diagnosis present

## 2019-07-08 MED ORDER — GADOBUTROL 1 MMOL/ML IV SOLN
9.0000 mL | Freq: Once | INTRAVENOUS | Status: AC | PRN
  Administered 2019-07-08: 9 mL via INTRAVENOUS

## 2020-07-26 IMAGING — DX DG CHEST 1V PORT
1 series · 1 of 1 positions shown · non-contrast
Comparison: None.

CLINICAL DATA: Short of breath.

EXAM:
PORTABLE CHEST 1 VIEW

[chest]
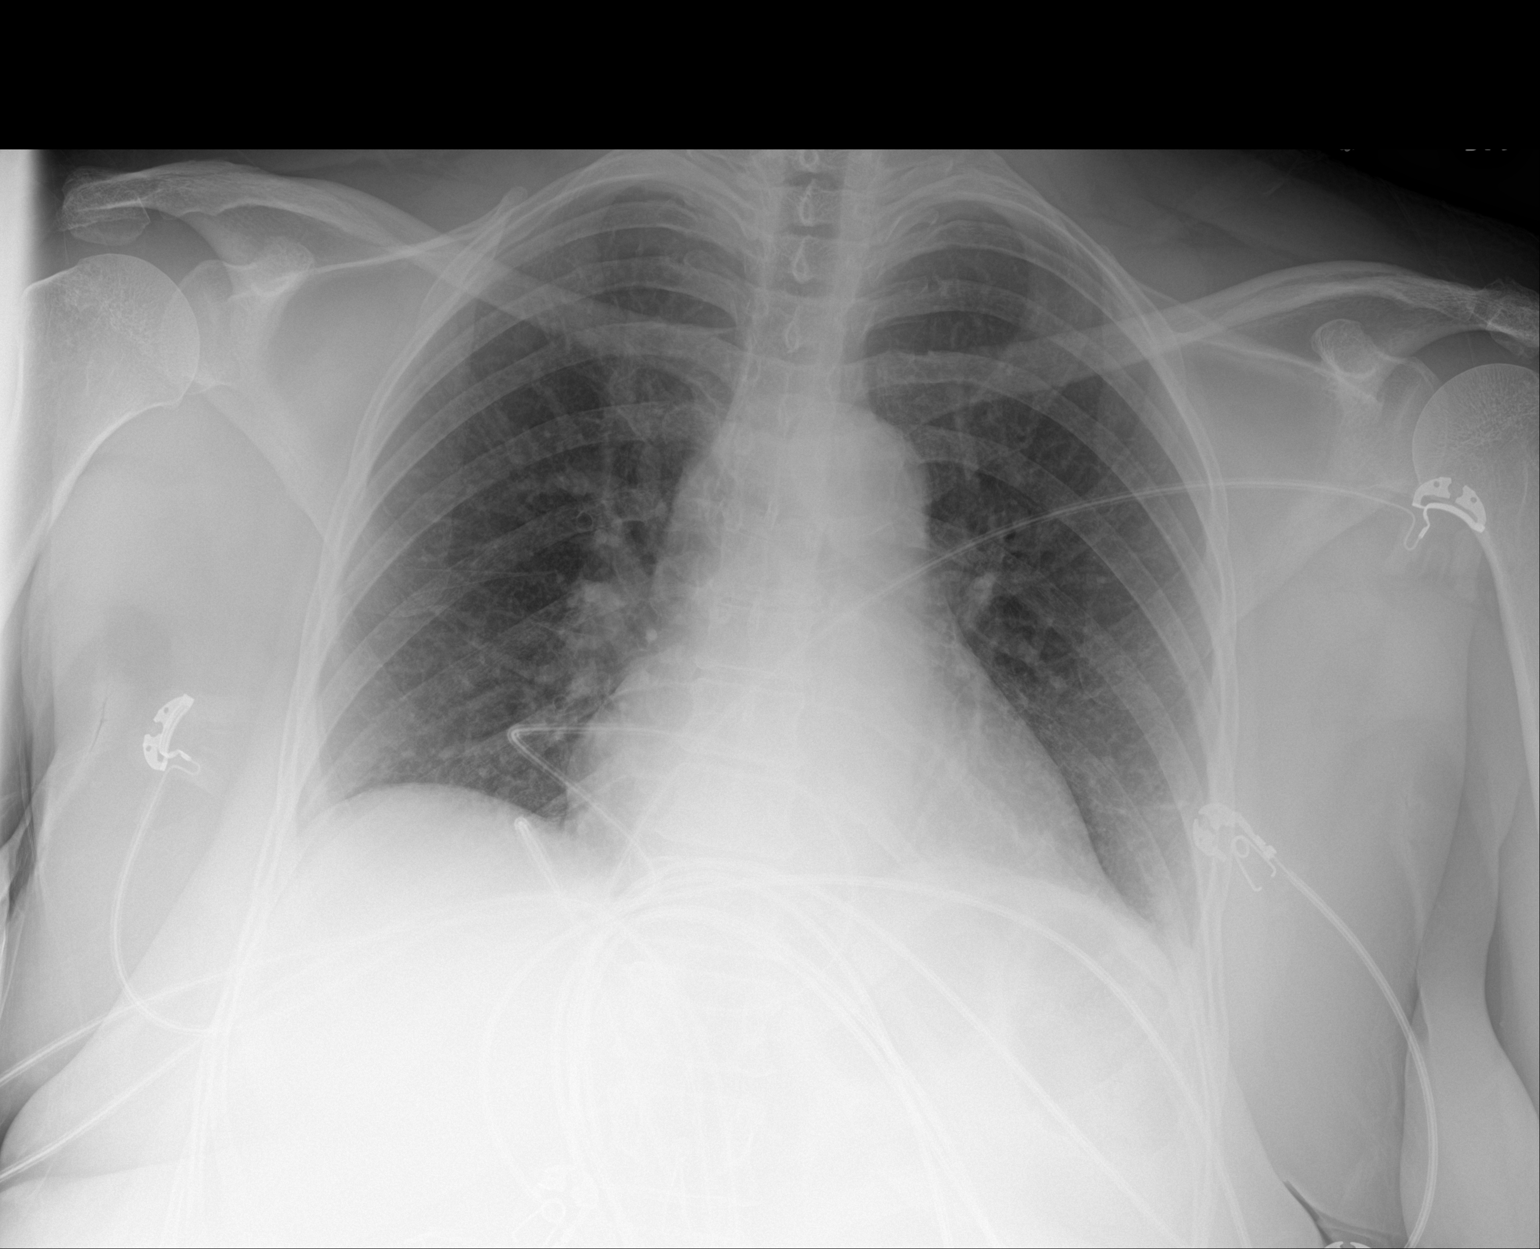

[1 of 1 positions shown; findings below may reference images not displayed]

FINDINGS: Cardiac silhouette normal in size. No mediastinal or hilar masses.
No evidence of adenopathy.

Clear lungs.  No pleural effusion or pneumothorax.

Skeletal structures are grossly intact.
IMPRESSION: No active disease.

## 2021-10-02 ENCOUNTER — Ambulatory Visit: Payer: Medicare Other | Admitting: Sports Medicine
